# Patient Record
Sex: Female | Born: 2005 | Race: White | Hispanic: No | Marital: Single
Health system: Southern US, Community
[De-identification: ages and names within clinical notes are randomized; demographics above are authoritative.]

---
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
## 2023-01-19 ENCOUNTER — Encounter: Payer: Self-pay | Admitting: Family Medicine

## 2023-03-05 NOTE — Progress Notes (Unsigned)
[facility] [address] [place] [address] Phone: [phone] Subjective:    I'm seeing this patient by the request  of:  Donna Nay, PA  CC:   [id number]  08/21/2021 Discussed with patient again. We want patient to start increasing but extremely slow. Patient will continue with the vitamin D. We discussed again about the potential female athlete triad and avoiding any type of overtraining. Discussed icing regimen and home exercises. Which activities to do which ones to avoid. Increase activity slowly otherwise. We discussed running progression which patient has had previously. Follow-up with me again 2 to 3 months if not perfect   Update 03/06/2023 Donna Medina is a 17 y.o. female coming in with complaint of R leg pain. Patient states        No past medical history on file. No past surgical history on file. Social History   Socioeconomic History   Marital status: Single    Spouse name: Not on file   Number of children: Not on file   Years of education: Not on file   Highest education level: Not on file  Occupational History   Not on file  Tobacco Use   Smoking status: Not on file   Smokeless tobacco: Not on file  Substance and Sexual Activity   Alcohol use: Not on file   Drug use: Not on file   Sexual activity: Not on file  Other Topics Concern   Not on file  Social History Narrative   Not on file   Social Drivers of Health   Financial Resource Strain: Not on file  Food Insecurity: No Food Insecurity (06/27/2021)   Received from [facility]   Hunger Vital Sign    Worried About Running Out of Food in the Last Year: Never true    Ran Out of Food in the Last Year: Never true  Transportation Needs: Not on file  Physical Activity: Not on file  Stress: Not on file  Social Connections: Unknown (08/06/2021)   Received from [facility]   Social Network    Social Network: Not on file   Not  on File No family history on file.       Current Outpatient Medications (Other):    Vitamin D, Ergocalciferol, (DRISDOL) 1.25 MG (50000 UNIT) CAPS capsule, Take 1 capsule (50,000 Units total) by mouth every 7 (seven) days.   Reviewed prior external information including notes and imaging from  primary care provider As well as notes that were available from care everywhere and other healthcare systems.  Past medical history, social, surgical and family history all reviewed in electronic medical record.  No pertanent information unless stated regarding to the chief complaint.   Review of Systems:  No headache, visual changes, nausea, vomiting, diarrhea, constipation, dizziness, abdominal pain, skin rash, fevers, chills, night sweats, weight loss, swollen lymph nodes, body aches, joint swelling, chest pain, shortness of breath, mood changes. POSITIVE muscle aches  Objective  There were no vitals taken for this visit.   General: No apparent distress alert and oriented x3 mood and affect normal, dressed appropriately.  HEENT: Pupils equal, extraocular movements intact  Respiratory: Patient's speak in full sentences and does not appear short of breath  Cardiovascular: No lower extremity edema, non tender, no erythema      Impression and Recommendations:    The above documentation has been reviewed and is accurate and complete Donna Saa, DO

## 2023-03-06 ENCOUNTER — Ambulatory Visit: Payer: 59 | Admitting: Family Medicine

## 2023-08-05 NOTE — Progress Notes (Unsigned)
 [facility] [address] [place] [address] Phone: [phone] Subjective:   Donna Medina, am serving as a scribe for Dr. Ronnell Coins.  I'm seeing this patient by the request  of:  Mayer Speaker, PA  CC: Lower leg pain  [id number]  08/21/2021 Discussed with patient again.  We want patient to start increasing but extremely slow.  Patient will continue with the vitamin D .  We discussed again about the potential female athlete triad and avoiding any type of overtraining.  Discussed icing regimen and home exercises.  Which activities to do which ones to avoid.  Increase activity slowly otherwise.  We discussed running progression which patient has had previously.  Follow-up with me again 2 to 3 months if not perfect      Update 08/06/2023 Donna Medina is a 18 y.o. female coming in with complaint of R leg pain. Patient states that her pain is over distal tibia. Able to lift without pain but running and jumping. Activity is less now than it was previously.       No past medical history on file. No past surgical history on file. Social History   Socioeconomic History   Marital status: Single    Spouse name: Not on file   Number of children: Not on file   Years of education: Not on file   Highest education level: Not on file  Occupational History   Not on file  Tobacco Use   Smoking status: Not on file   Smokeless tobacco: Not on file  Substance and Sexual Activity   Alcohol use: Not on file   Drug use: Not on file   Sexual activity: Not on file  Other Topics Concern   Not on file  Social History Narrative   Not on file   Social Drivers of Health   Financial Resource Strain: Low Risk  (03/29/2023)   Received from [facility]   Overall Financial Resource Strain (CARDIA)    Difficulty of Paying Living Expenses: Not very hard  Food Insecurity: No Food Insecurity (03/29/2023)   Received from [facility]   Hunger Vital Sign     Worried About Running Out of Food in the Last Year: Never true    Ran Out of Food in the Last Year: Never true  Transportation Needs: No Transportation Needs (03/29/2023)   Received from [facility] - Transportation    Lack of Transportation (Medical): No    Lack of Transportation (Non-Medical): No  Physical Activity: Sufficiently Active (03/29/2023)   Received from [facility]   Exercise Vital Sign    Days of Exercise per Week: 4 days    Minutes of Exercise per Session: 70 min  Stress: No Stress Concern Present (03/29/2023)   Received from [facility] - Occupational Stress Questionnaire    Feeling of Stress : Not at all  Social Connections: Socially Integrated (03/29/2023)   Received from [facility]   Social Network    How would you rate your social network (family, work, friends)?: Good participation with social networks   Not on File No family history on file.       Current Outpatient Medications (Other):    Vitamin D , Ergocalciferol , (DRISDOL ) 1.25 MG (50000 UNIT) CAPS capsule, Take 1 capsule (50,000 Units total) by mouth every 7 (seven) days.   Reviewed prior external information including notes and imaging from  primary care provider As well  as notes that were available from care everywhere and other healthcare systems.  Past medical history, social, surgical and family history all reviewed in electronic medical record.  No pertanent information unless stated regarding to the chief complaint.   Review of Systems:  No headache, visual changes, nausea, vomiting, diarrhea, constipation, dizziness, abdominal pain, skin rash, fevers, chills, night sweats, weight loss, swollen lymph nodes, body aches, joint swelling, chest pain, shortness of breath, mood changes. POSITIVE muscle aches  Objective  Blood pressure 100/78, pulse 95, height 5\' 2"  (1.575 m), SpO2 98%.   General: No apparent distress alert and oriented x3  mood and affect normal, dressed appropriately.  HEENT: Pupils equal, extraocular movements intact  Respiratory: Patient's speak in full sentences and does not appear short of breath  Cardiovascular: No lower extremity edema, non tender, no erythema  Patient's lower extremity does not show anything.  Patient does have significant amount of good muscle tone.  Patient is somewhat tender over the posterior aspect of the most proximal distal portion of the tibia.  No masses appreciated.  Seems to be more in the calf than even on the bone at the moment.   Limited muscular skeletal ultrasound was performed and interpreted by Ronnell Coins, M   Limited ultrasound does not show any type of periosteal hypoechoic changes noted at the moment.  No cortical irregularity noted at this time.   Impression and Recommendations:      The above documentation has been reviewed and is accurate and complete Cherokee Clowers M Lesha Jager, DO

## 2023-08-06 ENCOUNTER — Ambulatory Visit (INDEPENDENT_AMBULATORY_CARE_PROVIDER_SITE_OTHER)

## 2023-08-06 ENCOUNTER — Ambulatory Visit: Admitting: Family Medicine

## 2023-08-06 ENCOUNTER — Other Ambulatory Visit: Payer: Self-pay

## 2023-08-06 ENCOUNTER — Encounter: Payer: Self-pay | Admitting: Family Medicine

## 2023-08-06 VITALS — BP 100/78 | HR 95 | Ht 62.0 in

## 2023-08-06 DIAGNOSIS — M84369D Stress fracture, unspecified tibia and fibula, subsequent encounter for fracture with routine healing: Secondary | ICD-10-CM

## 2023-08-06 DIAGNOSIS — M79604 Pain in right leg: Secondary | ICD-10-CM

## 2023-08-06 DIAGNOSIS — M898X6 Other specified disorders of bone, lower leg: Secondary | ICD-10-CM

## 2023-08-06 DIAGNOSIS — M79605 Pain in left leg: Secondary | ICD-10-CM

## 2023-08-06 NOTE — Assessment & Plan Note (Addendum)
 Has had repetitive difficulty with the lower extremity.  Wondering if there is a potential atypical compression syndrome as well.  Will send patient for compartment testing.  At the same point patient is unable to start increasing activity and running.  Would like to consider getting another MRI to further evaluate and see if patient has healed or if other interventions are necessary.  Has not been able to run now for over 2 years.  Has done a bone stimulator that did have some difficulty with it and could also be another option depending on what we find.  Has failed formal physical therapy multiple rounds in multiple months, high doses vitamin D , new shoes and orthotics.

## 2023-08-06 NOTE — Patient Instructions (Addendum)
 MRI R tib fib [phone] Compartment testing Dr. Yvonne Hering will call you

## 2023-08-07 ENCOUNTER — Encounter: Payer: Self-pay | Admitting: Family Medicine

## 2023-08-14 ENCOUNTER — Ambulatory Visit
Admission: RE | Admit: 2023-08-14 | Discharge: 2023-08-14 | Disposition: A | Discharge status: Home / Self Care | Source: Ambulatory Visit | Attending: Family Medicine | Admitting: Family Medicine

## 2023-08-14 DIAGNOSIS — M898X6 Other specified disorders of bone, lower leg: Secondary | ICD-10-CM

## 2023-08-20 ENCOUNTER — Ambulatory Visit: Payer: Self-pay | Admitting: Family Medicine
